# Patient Record
Sex: Female | Born: 1945 | Race: White | Hispanic: No | Marital: Married | Smoking: Never smoker
Health system: Southern US, Community
[De-identification: ages and names within clinical notes are randomized; demographics above are authoritative.]

## PROBLEM LIST (undated history)

## (undated) DIAGNOSIS — Z9889 Other specified postprocedural states: Secondary | ICD-10-CM

## (undated) DIAGNOSIS — I10 Essential (primary) hypertension: Secondary | ICD-10-CM

## (undated) DIAGNOSIS — M25473 Effusion, unspecified ankle: Secondary | ICD-10-CM

## (undated) DIAGNOSIS — F419 Anxiety disorder, unspecified: Secondary | ICD-10-CM

## (undated) DIAGNOSIS — J45909 Unspecified asthma, uncomplicated: Secondary | ICD-10-CM

## (undated) DIAGNOSIS — F32A Depression, unspecified: Secondary | ICD-10-CM

## (undated) DIAGNOSIS — F329 Major depressive disorder, single episode, unspecified: Secondary | ICD-10-CM

## (undated) DIAGNOSIS — R251 Tremor, unspecified: Secondary | ICD-10-CM

## (undated) DIAGNOSIS — R112 Nausea with vomiting, unspecified: Secondary | ICD-10-CM

## (undated) HISTORY — DX: Tremor, unspecified: R25.1

## (undated) HISTORY — PX: FOOT SURGERY: SHX648

## (undated) HISTORY — PX: TUBAL LIGATION: SHX77

---

## 2005-01-10 ENCOUNTER — Encounter: Admission: RE | Admit: 2005-01-10 | Discharge: 2005-01-10 | Payer: Self-pay | Admitting: Obstetrics and Gynecology

## 2005-01-13 ENCOUNTER — Encounter: Admission: RE | Admit: 2005-01-13 | Discharge: 2005-01-13 | Payer: Self-pay | Admitting: Obstetrics and Gynecology

## 2006-01-26 ENCOUNTER — Encounter: Admission: RE | Admit: 2006-01-26 | Discharge: 2006-01-26 | Payer: Self-pay | Admitting: Obstetrics and Gynecology

## 2007-03-02 ENCOUNTER — Encounter: Admission: RE | Admit: 2007-03-02 | Discharge: 2007-03-02 | Payer: Self-pay | Admitting: Obstetrics and Gynecology

## 2008-03-17 ENCOUNTER — Encounter: Admission: RE | Admit: 2008-03-17 | Discharge: 2008-03-17 | Payer: Self-pay | Admitting: Obstetrics and Gynecology

## 2009-03-23 ENCOUNTER — Encounter: Admission: RE | Admit: 2009-03-23 | Discharge: 2009-03-23 | Payer: Self-pay | Admitting: Internal Medicine

## 2010-04-01 ENCOUNTER — Encounter: Admission: RE | Admit: 2010-04-01 | Discharge: 2010-04-01 | Payer: Self-pay | Admitting: Internal Medicine

## 2010-09-12 ENCOUNTER — Encounter: Payer: Self-pay | Admitting: Obstetrics and Gynecology

## 2011-02-24 ENCOUNTER — Other Ambulatory Visit: Payer: Self-pay | Admitting: Internal Medicine

## 2011-02-24 DIAGNOSIS — Z1231 Encounter for screening mammogram for malignant neoplasm of breast: Secondary | ICD-10-CM

## 2011-04-04 ENCOUNTER — Ambulatory Visit
Admission: RE | Admit: 2011-04-04 | Discharge: 2011-04-04 | Disposition: A | Discharge status: Home / Self Care | Source: Ambulatory Visit | Attending: Internal Medicine | Admitting: Internal Medicine

## 2011-04-04 DIAGNOSIS — Z1231 Encounter for screening mammogram for malignant neoplasm of breast: Secondary | ICD-10-CM

## 2012-03-06 ENCOUNTER — Other Ambulatory Visit: Payer: Self-pay | Admitting: Internal Medicine

## 2012-03-06 DIAGNOSIS — Z1231 Encounter for screening mammogram for malignant neoplasm of breast: Secondary | ICD-10-CM

## 2012-04-05 ENCOUNTER — Ambulatory Visit
Admission: RE | Admit: 2012-04-05 | Discharge: 2012-04-05 | Disposition: A | Discharge status: Home / Self Care | Source: Ambulatory Visit | Attending: Internal Medicine | Admitting: Internal Medicine

## 2012-04-05 DIAGNOSIS — Z1231 Encounter for screening mammogram for malignant neoplasm of breast: Secondary | ICD-10-CM

## 2013-03-04 ENCOUNTER — Other Ambulatory Visit: Payer: Self-pay

## 2013-03-04 DIAGNOSIS — Z1231 Encounter for screening mammogram for malignant neoplasm of breast: Secondary | ICD-10-CM

## 2013-04-16 ENCOUNTER — Ambulatory Visit
Admission: RE | Admit: 2013-04-16 | Discharge: 2013-04-16 | Disposition: A | Discharge status: Home / Self Care | Payer: Medicare Other | Source: Ambulatory Visit

## 2013-04-16 DIAGNOSIS — Z1231 Encounter for screening mammogram for malignant neoplasm of breast: Secondary | ICD-10-CM

## 2013-04-29 ENCOUNTER — Other Ambulatory Visit: Payer: Self-pay | Admitting: Internal Medicine

## 2013-04-29 DIAGNOSIS — M0520 Rheumatoid vasculitis with rheumatoid arthritis of unspecified site: Secondary | ICD-10-CM

## 2013-04-29 DIAGNOSIS — M8580 Other specified disorders of bone density and structure, unspecified site: Secondary | ICD-10-CM

## 2013-05-21 ENCOUNTER — Ambulatory Visit
Admission: RE | Admit: 2013-05-21 | Discharge: 2013-05-21 | Disposition: A | Discharge status: Home / Self Care | Payer: Medicare Other | Source: Ambulatory Visit | Attending: Internal Medicine | Admitting: Internal Medicine

## 2013-05-21 DIAGNOSIS — M0520 Rheumatoid vasculitis with rheumatoid arthritis of unspecified site: Secondary | ICD-10-CM

## 2013-05-21 DIAGNOSIS — M8580 Other specified disorders of bone density and structure, unspecified site: Secondary | ICD-10-CM

## 2014-03-21 ENCOUNTER — Other Ambulatory Visit: Payer: Self-pay

## 2014-03-21 DIAGNOSIS — Z1231 Encounter for screening mammogram for malignant neoplasm of breast: Secondary | ICD-10-CM

## 2014-04-18 ENCOUNTER — Ambulatory Visit
Admission: RE | Admit: 2014-04-18 | Discharge: 2014-04-18 | Disposition: A | Discharge status: Home / Self Care | Payer: Medicare Other | Source: Ambulatory Visit

## 2014-04-18 DIAGNOSIS — Z1231 Encounter for screening mammogram for malignant neoplasm of breast: Secondary | ICD-10-CM

## 2015-04-25 ENCOUNTER — Emergency Department (HOSPITAL_BASED_OUTPATIENT_CLINIC_OR_DEPARTMENT_OTHER): Payer: Medicare Other

## 2015-04-25 ENCOUNTER — Encounter (HOSPITAL_BASED_OUTPATIENT_CLINIC_OR_DEPARTMENT_OTHER): Payer: Self-pay | Admitting: *Deleted

## 2015-04-25 ENCOUNTER — Emergency Department (HOSPITAL_BASED_OUTPATIENT_CLINIC_OR_DEPARTMENT_OTHER)
Admission: EM | Admit: 2015-04-25 | Discharge: 2015-04-25 | Disposition: A | Discharge status: Home / Self Care | Payer: Medicare Other | Attending: Emergency Medicine | Admitting: Emergency Medicine

## 2015-04-25 DIAGNOSIS — S52591A Other fractures of lower end of right radius, initial encounter for closed fracture: Secondary | ICD-10-CM | POA: Insufficient documentation

## 2015-04-25 DIAGNOSIS — Y9389 Activity, other specified: Secondary | ICD-10-CM | POA: Diagnosis not present

## 2015-04-25 DIAGNOSIS — Y9289 Other specified places as the place of occurrence of the external cause: Secondary | ICD-10-CM | POA: Diagnosis not present

## 2015-04-25 DIAGNOSIS — J45909 Unspecified asthma, uncomplicated: Secondary | ICD-10-CM | POA: Insufficient documentation

## 2015-04-25 DIAGNOSIS — W010XXA Fall on same level from slipping, tripping and stumbling without subsequent striking against object, initial encounter: Secondary | ICD-10-CM | POA: Diagnosis not present

## 2015-04-25 DIAGNOSIS — Z7951 Long term (current) use of inhaled steroids: Secondary | ICD-10-CM | POA: Diagnosis not present

## 2015-04-25 DIAGNOSIS — Y998 Other external cause status: Secondary | ICD-10-CM | POA: Diagnosis not present

## 2015-04-25 DIAGNOSIS — Z880 Allergy status to penicillin: Secondary | ICD-10-CM | POA: Insufficient documentation

## 2015-04-25 DIAGNOSIS — S6991XA Unspecified injury of right wrist, hand and finger(s), initial encounter: Secondary | ICD-10-CM | POA: Diagnosis present

## 2015-04-25 DIAGNOSIS — S52501A Unspecified fracture of the lower end of right radius, initial encounter for closed fracture: Secondary | ICD-10-CM

## 2015-04-25 HISTORY — DX: Unspecified asthma, uncomplicated: J45.909

## 2015-04-25 MED ORDER — OXYCODONE-ACETAMINOPHEN 5-325 MG PO TABS: 1.0000 | ORAL_TABLET | ORAL | Status: DC | PRN

## 2015-04-25 MED ORDER — ONDANSETRON 4 MG PO TBDP: ORAL_TABLET | ORAL | Status: DC

## 2015-04-25 MED ORDER — TRAMADOL HCL 50 MG PO TABS: 50.0000 mg | ORAL_TABLET | Freq: Four times a day (QID) | ORAL | Status: AC | PRN

## 2015-04-25 NOTE — Discharge Instructions (Signed)
Radial Fracture °You have a broken bone (fracture) of the forearm. This is the part of your arm between the elbow and your wrist. Your forearm is made up of two bones. These are the radius and ulna. Your fracture is in the radial shaft. This is the bone in your forearm located on the thumb side. A cast or splint is used to protect and keep your injured bone from moving. The cast or splint will be on generally for about 5 to 6 weeks, with individual variations. °HOME CARE INSTRUCTIONS  °· Keep the injured part elevated while sitting or lying down. Keep the injury above the level of your heart (the center of the chest). This will decrease swelling and pain. °· Apply ice to the injury for 15-20 minutes, 03-04 times per day while awake, for 2 days. Put the ice in a plastic bag and place a towel between the bag of ice and your cast or splint. °· Move your fingers to avoid stiffness and minimize swelling. °· If you have a plaster or fiberglass cast: °¨ Do not try to scratch the skin under the cast using sharp or pointed objects. °¨ Check the skin around the cast every day. You may put lotion on any red or sore areas. °¨ Keep your cast dry and clean. °· If you have a plaster splint: °¨ Wear the splint as directed. °¨ You may loosen the elastic around the splint if your fingers become numb, tingle, or turn cold or blue. °¨ Do not put pressure on any part of your cast or splint. It may break. Rest your cast only on a pillow for the first 24 hours until it is fully hardened. °· Your cast or splint can be protected during bathing with a plastic bag. Do not lower the cast or splint into water. °· Only take over-the-counter or prescription medicines for pain, discomfort, or fever as directed by your caregiver. °SEEK IMMEDIATE MEDICAL CARE IF:  °· Your cast gets damaged or breaks. °· You have more severe pain or swelling than you did before getting the cast. °· You have severe pain when stretching your fingers. °· There is a bad  smell, new stains and/or pus-like (purulent) drainage coming from under the cast. °· Your fingers or hand turn pale or blue and become cold or your loose feeling. °Document Released: 01/19/2006 Document Revised: 10/31/2011 Document Reviewed: 04/17/2006 °ExitCare® Patient Information ©2015 [employer]. This information is not intended to replace advice given to you by your health care provider. Make sure you discuss any questions you have with your health care provider. ° °

## 2015-04-25 NOTE — ED Notes (Signed)
Patient transported to X-ray 

## 2015-04-25 NOTE — ED Provider Notes (Signed)
CSN: [id number]     Arrival date & time 04/25/15  0941 History   First MD Initiated Contact with Patient 04/25/15 1007     Chief Complaint  Patient presents with  . Wrist Injury     (Consider location/radiation/quality/duration/timing/severity/associated sxs/prior Treatment) HPI Comments: Patient presents with right wrist pain. She states that about an hour ago she was doing yoga and had a fall, landing on her right hand. This was a boost type injury. She complains of constant pain and throbbing to her right wrist since that time. She denies any numbness in the hand. She denies any other injuries other than she feels like her knee is a little bit bruised. She did not hit her head. There is no neck or back pain. She is left-hand dominant.  Patient is a 68 y.o. female presenting with wrist injury.  Wrist Injury Associated symptoms: no back pain, no fever and no neck pain     Past Medical History  Diagnosis Date  . Asthma    Past Surgical History  Procedure Laterality Date  . Tubal ligation     History reviewed. No pertinent family history. Social History  Substance Use Topics  . Smoking status: Never Smoker   . Smokeless tobacco: None  . Alcohol Use: No   OB History    No data available     Review of Systems  Constitutional: Negative for fever.  Gastrointestinal: Negative for nausea and vomiting.  Musculoskeletal: Positive for joint swelling and arthralgias. Negative for back pain and neck pain.  Skin: Negative for wound.  Neurological: Negative for weakness, numbness and headaches.      Allergies  Penicillins and Sulfa antibiotics  Home Medications   Prior to Admission medications   Medication Sig Start Date End Date Taking? Authorizing Provider  Fluticasone-Salmeterol (ADVAIR) 100-50 MCG/DOSE AEPB Inhale 1 puff into the lungs 2 (two) times daily.   Yes Historical Provider, MD  ondansetron (ZOFRAN ODT) 4 MG disintegrating tablet 4mg  ODT q4 hours prn nausea/vomit  04/25/15   Rolan Bucco, MD  oxyCODONE-acetaminophen (PERCOCET) 5-325 MG per tablet Take 1-2 tablets by mouth every 4 (four) hours as needed. 04/25/15   Rolan Bucco, MD  traMADol (ULTRAM) 50 MG tablet Take 1 tablet (50 mg total) by mouth every 6 (six) hours as needed. 04/25/15   Rolan Bucco, MD   BP 111/78 mmHg  Pulse 88  Temp(Src) 98.1 F (36.7 C) (Oral)  Resp 18  Ht 5\' 5"  (1.651 m)  Wt 142 lb (64.411 kg)  BMI 23.63 kg/m2  SpO2 99% Physical Exam  Constitutional: She is oriented to person, place, and time. She appears well-developed and well-nourished.  HENT:  Head: Normocephalic and atraumatic.  Neck: Normal range of motion. Neck supple.  Cardiovascular: Normal rate.   Pulmonary/Chest: Effort normal.  Musculoskeletal: She exhibits edema and tenderness.  Patient has swelling and tenderness to the distal radius. There is no pain to the right hand. No pain to the elbow. No pain to the shoulder. She has normal sensation in the hand. No wounds are noted. She has some limited extension of the thumb due to discomfort but otherwise has normal movement in the hand. She has some minor ecchymosis to the right knee but no significant underlying bony tenderness. No other pain on palpation or range of motion extremities. Radial pulses are intact.  Neurological: She is alert and oriented to person, place, and time.  Skin: Skin is warm and dry.  Psychiatric: She has a normal mood and  affect.    ED Course  Procedures (including critical care time) Labs Review Labs Reviewed - No data to display  Imaging Review Dg Wrist Complete Right  04/25/2015   CLINICAL DATA:  Post fall onto wrist doing yoga now with wrist pain and swelling.  EXAM: RIGHT WRIST - COMPLETE 3+ VIEW  COMPARISON:  None.  FINDINGS: There is a comminuted, displaced fracture involving the distal radius, with foreshortening and angulation, apex dorsal with extension to involve the distal radial carpal joint. Additionally, there is a  minimally displaced ulnar styloid process fracture. No dislocation. Joint spaces are preserved. Expected displacement of pronator quadratus fat pad. Regional soft tissue swelling. No radiopaque foreign body.  IMPRESSION: 1. Acute comminuted displaced fracture involving the distal radius with extension to involve the distal radial carpal joint. 2. Acute minimally displaced fracture of the ulnar styloid process.   Electronically Signed   By: Simonne Come M.D.   On: 04/25/2015 10:12   I have personally reviewed and evaluated these images and lab results as part of my medical decision-making.   EKG Interpretation None      MDM   Final diagnoses:  Distal radius fracture, right, closed, initial encounter    Patient has a distal radius fracture as well as an ulnar styloid fracture. I consulted with Dr. Cheree Ditto made to his reviewed the films. He feels that it needs surgical fixation. His plan is to do surgery tomorrow. I did advise the patient of this. She was placed in a sugar tong splint and the swelling. She states that she normally doesn't do well with pain medication so I gave her prescription for tramadol and Percocet to see which one that she can tolerate. I advised her not to take them both together. I also gave her prescription for Zofran for nausea. [facility] OR will call her with the time for surgery tomorrow. I did advise her to be nothing by mouth after midnight tonight.    Rolan Bucco, MD 04/25/15 1130

## 2015-04-25 NOTE — ED Notes (Signed)
Pt given an ice pack  °

## 2015-04-25 NOTE — ED Notes (Signed)
Pt c/o right wrist injury x 1 hr ago  

## 2015-04-25 NOTE — ED Notes (Signed)
MD at bedside. 

## 2015-04-26 ENCOUNTER — Encounter (HOSPITAL_COMMUNITY): Admission: EM | Disposition: A | Discharge status: Home / Self Care | Payer: Self-pay | Attending: Orthopedic Surgery

## 2015-04-26 ENCOUNTER — Ambulatory Visit (HOSPITAL_COMMUNITY)
Admission: EM | Admit: 2015-04-26 | Discharge: 2015-04-26 | Disposition: A | Discharge status: Home / Self Care | Payer: Medicare Other | Source: Intra-hospital | Attending: Orthopedic Surgery | Admitting: Orthopedic Surgery

## 2015-04-26 ENCOUNTER — Ambulatory Visit (HOSPITAL_COMMUNITY): Payer: Medicare Other | Admitting: Anesthesiology

## 2015-04-26 ENCOUNTER — Encounter (HOSPITAL_COMMUNITY): Payer: Self-pay | Admitting: *Deleted

## 2015-04-26 DIAGNOSIS — Y9342 Activity, yoga: Secondary | ICD-10-CM | POA: Insufficient documentation

## 2015-04-26 DIAGNOSIS — F329 Major depressive disorder, single episode, unspecified: Secondary | ICD-10-CM | POA: Insufficient documentation

## 2015-04-26 DIAGNOSIS — S52571A Other intraarticular fracture of lower end of right radius, initial encounter for closed fracture: Secondary | ICD-10-CM | POA: Diagnosis not present

## 2015-04-26 DIAGNOSIS — I10 Essential (primary) hypertension: Secondary | ICD-10-CM | POA: Insufficient documentation

## 2015-04-26 DIAGNOSIS — Z880 Allergy status to penicillin: Secondary | ICD-10-CM | POA: Diagnosis not present

## 2015-04-26 DIAGNOSIS — X58XXXA Exposure to other specified factors, initial encounter: Secondary | ICD-10-CM | POA: Diagnosis not present

## 2015-04-26 HISTORY — DX: Other specified postprocedural states: Z98.890

## 2015-04-26 HISTORY — DX: Depression, unspecified: F32.A

## 2015-04-26 HISTORY — PX: ORIF WRIST FRACTURE: SHX2133

## 2015-04-26 HISTORY — DX: Anxiety disorder, unspecified: F41.9

## 2015-04-26 HISTORY — DX: Nausea with vomiting, unspecified: R11.2

## 2015-04-26 HISTORY — DX: Essential (primary) hypertension: I10

## 2015-04-26 HISTORY — DX: Effusion, unspecified ankle: M25.473

## 2015-04-26 HISTORY — DX: Major depressive disorder, single episode, unspecified: F32.9

## 2015-04-26 LAB — POCT I-STAT 4, (NA,K, GLUC, HGB,HCT)
Glucose, Bld: 88 mg/dL (ref 65–99)
HEMATOCRIT: 46 % (ref 36.0–46.0)
Hemoglobin: 15.6 g/dL — ABNORMAL HIGH (ref 12.0–15.0)
Potassium: 3.3 mmol/L — ABNORMAL LOW (ref 3.5–5.1)
SODIUM: 127 mmol/L — AB (ref 135–145)

## 2015-04-26 SURGERY — OPEN REDUCTION INTERNAL FIXATION (ORIF) WRIST FRACTURE
Anesthesia: General | Site: Wrist | Laterality: Right

## 2015-04-26 MED ORDER — MIDAZOLAM HCL 5 MG/5ML IJ SOLN
INTRAMUSCULAR | Status: DC | PRN
  Administered 2015-04-26: 2 mg via INTRAVENOUS

## 2015-04-26 MED ORDER — 0.9 % SODIUM CHLORIDE (POUR BTL) OPTIME
TOPICAL | Status: DC | PRN
  Administered 2015-04-26: 1000 mL

## 2015-04-26 MED ORDER — OXYCODONE HCL 5 MG PO TABS: 5.0000 mg | ORAL_TABLET | ORAL | Status: DC | PRN

## 2015-04-26 MED ORDER — HYDROMORPHONE HCL 1 MG/ML IJ SOLN: 0.2500 mg | INTRAMUSCULAR | Status: DC | PRN

## 2015-04-26 MED ORDER — VANCOMYCIN HCL IN DEXTROSE 1-5 GM/200ML-% IV SOLN
INTRAVENOUS | Status: AC
  Filled 2015-04-26: qty 200

## 2015-04-26 MED ORDER — ONDANSETRON HCL 4 MG/2ML IJ SOLN
INTRAMUSCULAR | Status: DC | PRN
  Administered 2015-04-26: 4 mg via INTRAVENOUS

## 2015-04-26 MED ORDER — DOXYCYCLINE HYCLATE 50 MG PO CAPS: 100.0000 mg | ORAL_CAPSULE | Freq: Two times a day (BID) | ORAL | Status: DC

## 2015-04-26 MED ORDER — PROMETHAZINE HCL 25 MG/ML IJ SOLN: 6.2500 mg | INTRAMUSCULAR | Status: DC | PRN

## 2015-04-26 MED ORDER — FENTANYL CITRATE (PF) 100 MCG/2ML IJ SOLN
INTRAMUSCULAR | Status: AC
  Administered 2015-04-26: 100 ug via INTRAVENOUS
  Filled 2015-04-26: qty 2

## 2015-04-26 MED ORDER — LACTATED RINGERS IV SOLN
INTRAVENOUS | Status: DC | PRN
  Administered 2015-04-26: 10:00:00 via INTRAVENOUS

## 2015-04-26 MED ORDER — MIDAZOLAM HCL 2 MG/2ML IJ SOLN
INTRAMUSCULAR | Status: AC
  Filled 2015-04-26: qty 2

## 2015-04-26 MED ORDER — VANCOMYCIN HCL 1000 MG IV SOLR
1000.0000 mg | INTRAVENOUS | Status: DC | PRN
  Administered 2015-04-26: 1000 mg via INTRAVENOUS

## 2015-04-26 MED ORDER — BUPIVACAINE-EPINEPHRINE (PF) 0.5% -1:200000 IJ SOLN
INTRAMUSCULAR | Status: DC | PRN
  Administered 2015-04-26: 30 mL via PERINEURAL

## 2015-04-26 MED ORDER — PROPOFOL INFUSION 10 MG/ML OPTIME
INTRAVENOUS | Status: DC | PRN
  Administered 2015-04-26: 50 ug/kg/min via INTRAVENOUS

## 2015-04-26 SURGICAL SUPPLY — 63 items
BANDAGE ELASTIC 3 VELCRO ST LF (GAUZE/BANDAGES/DRESSINGS) ×3 IMPLANT
BANDAGE ELASTIC 4 VELCRO ST LF (GAUZE/BANDAGES/DRESSINGS) ×3 IMPLANT
BIT DRILL 2.2 SS TIBIAL (BIT) ×3 IMPLANT
BLADE SURG ROTATE 9660 (MISCELLANEOUS) IMPLANT
BNDG CONFORM 2 STRL LF (GAUZE/BANDAGES/DRESSINGS) ×3 IMPLANT
BNDG ESMARK 4X9 LF (GAUZE/BANDAGES/DRESSINGS) ×3 IMPLANT
BNDG GAUZE ELAST 4 BULKY (GAUZE/BANDAGES/DRESSINGS) ×3 IMPLANT
CANISTER SUCTION 2500CC (MISCELLANEOUS) ×3 IMPLANT
CORDS BIPOLAR (ELECTRODE) ×3 IMPLANT
COVER SURGICAL LIGHT HANDLE (MISCELLANEOUS) ×3 IMPLANT
CUFF TOURNIQUET SINGLE 18IN (TOURNIQUET CUFF) ×3 IMPLANT
CUFF TOURNIQUET SINGLE 24IN (TOURNIQUET CUFF) IMPLANT
DRAIN TLS ROUND 10FR (DRAIN) IMPLANT
DRAPE OEC MINIVIEW 54X84 (DRAPES) IMPLANT
DRAPE SURG 17X23 STRL (DRAPES) ×3 IMPLANT
DRSG ADAPTIC 3X8 NADH LF (GAUZE/BANDAGES/DRESSINGS) ×3 IMPLANT
GAUZE SPONGE 4X4 12PLY STRL (GAUZE/BANDAGES/DRESSINGS) ×3 IMPLANT
GAUZE XEROFORM 1X8 LF (GAUZE/BANDAGES/DRESSINGS) ×3 IMPLANT
GLOVE BIOGEL M STRL SZ7.5 (GLOVE) ×3 IMPLANT
GLOVE SS BIOGEL STRL SZ 8 (GLOVE) ×1 IMPLANT
GLOVE SUPERSENSE BIOGEL SZ 8 (GLOVE) ×2
GOWN STRL REUS W/ TWL LRG LVL3 (GOWN DISPOSABLE) ×1 IMPLANT
GOWN STRL REUS W/ TWL XL LVL3 (GOWN DISPOSABLE) ×2 IMPLANT
GOWN STRL REUS W/TWL LRG LVL3 (GOWN DISPOSABLE) ×2
GOWN STRL REUS W/TWL XL LVL3 (GOWN DISPOSABLE) ×4
KIT BASIN OR (CUSTOM PROCEDURE TRAY) ×3 IMPLANT
KIT ROOM TURNOVER OR (KITS) ×3 IMPLANT
LOOP VESSEL MAXI BLUE (MISCELLANEOUS) IMPLANT
MANIFOLD NEPTUNE II (INSTRUMENTS) ×3 IMPLANT
NEEDLE 22X1 1/2 (OR ONLY) (NEEDLE) IMPLANT
NS IRRIG 1000ML POUR BTL (IV SOLUTION) ×3 IMPLANT
PACK ORTHO EXTREMITY (CUSTOM PROCEDURE TRAY) ×3 IMPLANT
PAD ARMBOARD 7.5X6 YLW CONV (MISCELLANEOUS) ×6 IMPLANT
PAD CAST 3X4 CTTN HI CHSV (CAST SUPPLIES) ×1 IMPLANT
PAD CAST 4YDX4 CTTN HI CHSV (CAST SUPPLIES) ×1 IMPLANT
PADDING CAST COTTON 3X4 STRL (CAST SUPPLIES) ×2
PADDING CAST COTTON 4X4 STRL (CAST SUPPLIES) ×2
PEG LOCKING SMOOTH 2.2X18 (Peg) ×6 IMPLANT
PEG LOCKING SMOOTH 2.2X20 (Screw) ×6 IMPLANT
PEG LOCKING SMOOTH 2.2X22 (Screw) ×3 IMPLANT
PLATE DVR VOLAR RIM NARROW RT (Plate) ×3 IMPLANT
PUTTY DBM STAGRAFT PLUS 2CC (Putty) ×3 IMPLANT
SCREW LOCK 14X2.7X 3 LD TPR (Screw) ×3 IMPLANT
SCREW LOCK 18X2.7X 3 LD TPR (Screw) ×1 IMPLANT
SCREW LOCKING 2.7X13MM (Screw) ×3 IMPLANT
SCREW LOCKING 2.7X14 (Screw) ×6 IMPLANT
SCREW LOCKING 2.7X15MM (Screw) ×3 IMPLANT
SCREW LOCKING 2.7X18 (Screw) ×2 IMPLANT
SCREW MULTI DIRECTIONAL 2.7X24 (Screw) ×3 IMPLANT
SPLINT FIBERGLASS 3X12 (CAST SUPPLIES) ×3 IMPLANT
SPONGE LAP 4X18 X RAY DECT (DISPOSABLE) IMPLANT
SUT MNCRL AB 4-0 PS2 18 (SUTURE) ×3 IMPLANT
SUT PROLENE 3 0 PS 2 (SUTURE) IMPLANT
SUT PROLENE 4 0 P 3 18 (SUTURE) ×6 IMPLANT
SUT VIC AB 3-0 FS2 27 (SUTURE) ×3 IMPLANT
SYR CONTROL 10ML LL (SYRINGE) IMPLANT
SYSTEM CHEST DRAIN TLS 7FR (DRAIN) ×3 IMPLANT
TOWEL OR 17X24 6PK STRL BLUE (TOWEL DISPOSABLE) ×3 IMPLANT
TOWEL OR 17X26 10 PK STRL BLUE (TOWEL DISPOSABLE) ×3 IMPLANT
TUBE CONNECTING 12'X1/4 (SUCTIONS) ×1
TUBE CONNECTING 12X1/4 (SUCTIONS) ×2 IMPLANT
TUBE EVACUATION TLS (MISCELLANEOUS) ×3 IMPLANT
WATER STERILE IRR 1000ML POUR (IV SOLUTION) ×3 IMPLANT

## 2015-04-26 NOTE — Op Note (Signed)
See dictation [id number]  Status post comminuted complex ORIF right distal radius fracture with volar Barton's component  Terry Thomas M.D.

## 2015-04-26 NOTE — Progress Notes (Signed)
Per Dr. Krista Blue no EKG needed.

## 2015-04-26 NOTE — Anesthesia Procedure Notes (Signed)
Anesthesia Regional Block:  Supraclavicular block  Pre-Anesthetic Checklist: ,, timeout performed, Correct Patient, Correct Site, Correct Laterality, Correct Procedure,, site marked, risks and benefits discussed, Surgical consent,  Pre-op evaluation,  At surgeon's request and post-op pain management  Laterality: Right  Prep: chloraprep       Needles:  Injection technique: Single-shot  Needle Type: Echogenic Stimulator Needle     Needle Length: 5cm 5 cm Needle Gauge: 22 and 22 G    Additional Needles:  Procedures: ultrasound guided (picture in chart) and nerve stimulator Supraclavicular block  Nerve Stimulator or Paresthesia:  Response: bicep contraction, 0.48 mA,   Additional Responses:   Narrative:  Start time: 04/26/2015 10:17 AM End time: 04/26/2015 10:27 AM Injection made incrementally with aspirations every 5 mL.  Performed by: Personally   Additional Notes: Functioning IV was confirmed and monitors applied.  A 50mm 22ga echogenic arrow stimulator was used. Sterile prep and drape,hand hygiene and sterile gloves were used.Ultrasound guidance: relevent anatomy identified, needle position confirmed, local anesthetic spread visualized around nerve(s)., vascular puncture avoided.  Image printed for medical record.  Negative aspiration and negative test dose prior to incremental administration of local anesthetic. The patient tolerated the procedure well.

## 2015-04-26 NOTE — Anesthesia Postprocedure Evaluation (Signed)
Anesthesia Post Note  Patient: Terry Thomas  Procedure(s) Performed: Procedure(s) (LRB): OPEN REDUCTION INTERNAL FIXATION (ORIF) WRIST FRACTURE (Right)  Anesthesia type: MAC  Patient location: PACU  Post pain: Pain level controlled  Post assessment: Patient's Cardiovascular Status Stable  Last Vitals:  Filed Vitals:   04/26/15 0953  BP: 123/74  Pulse: 88  Temp: 36.8 C  Resp: 18    Post vital signs: Reviewed and stable  Level of consciousness: sedated  Complications: No apparent anesthesia complications

## 2015-04-26 NOTE — Transfer of Care (Signed)
Immediate Anesthesia Transfer of Care Note  Patient: Terry Thomas  Procedure(s) Performed: Procedure(s): OPEN REDUCTION INTERNAL FIXATION (ORIF) WRIST FRACTURE (Right)  Patient Location: PACU  Anesthesia Type:Regional and MAC combined with regional for post-op pain  Level of Consciousness: awake, oriented and patient cooperative  Airway & Oxygen Therapy: Patient Spontanous Breathing  Post-op Assessment: Report given to RN, Post -op Vital signs reviewed and stable, Patient moving all extremities and X 3  Post vital signs: Reviewed and stable  Last Vitals:  Filed Vitals:   04/26/15 0953  BP: 123/74  Pulse: 88  Temp: 36.8 C  Resp: 18    Complications: No apparent anesthesia complications

## 2015-04-26 NOTE — H&P (Signed)
Terry Thomas is an 69 y.o. female.   Chief Complaint: Right distal radius fracture comminuted complex intra-articular nature greater than 5 parts HPI: Patient presents for open reduction internal fixation right distal radius fracture. This a greater than 5 part interarticular fracture. Patient denies other issues.  Patient injured the wrist while doing yoga.  She denies neck back chest or abdominal pain  Past Medical History  Diagnosis Date  . Asthma   . PONV (postoperative nausea and vomiting)   . Anxiety   . Depression   . Ankle edema   . Hypertension     history of     Past Surgical History  Procedure Laterality Date  . Tubal ligation      History reviewed. No pertinent family history. Social History:  reports that she has never smoked. She does not have any smokeless tobacco history on file. She reports that she does not drink alcohol or use illicit drugs.  Allergies:  Allergies  Allergen Reactions  . Penicillins   . Percocet [Oxycodone-Acetaminophen] Nausea And Vomiting    *pt reports she can take but prefers not to due to severe nausea/vomiting*  . Sulfa Antibiotics     Medications Prior to Admission  Medication Sig Dispense Refill  . ADVAIR DISKUS 500-50 MCG/DOSE AEPB Inhale 1 puff into the lungs 2 (two) times daily.    Marland Kitchen albuterol (PROVENTIL HFA;VENTOLIN HFA) 108 (90 BASE) MCG/ACT inhaler Inhale 1-2 puffs into the lungs every 6 (six) hours as needed for wheezing or shortness of breath.    . ALPRAZolam (XANAX) 0.25 MG tablet Take 0.25 mg by mouth daily as needed for anxiety.     . budesonide (RHINOCORT ALLERGY) 32 MCG/ACT nasal spray Place 2 sprays into both nostrils every morning.    . Cholecalciferol (VITAMIN D-3) 1000 UNITS CAPS Take 1,000 Units by mouth daily.    . clonazePAM (KLONOPIN) 1 MG tablet Take 1 mg by mouth at bedtime as needed (sleep).     Marland Kitchen estradiol-norethindrone (ACTIVELLA) 1-0.5 MG per tablet Take 1 tablet by mouth daily.    . Homeopathic  Products (ALLERGY MEDICINE PO) Take 1 tablet by mouth daily.    . hydrochlorothiazide (MICROZIDE) 12.5 MG capsule Take 12.5 mg by mouth daily as needed (edema).    Marland Kitchen ibuprofen (ADVIL,MOTRIN) 200 MG tablet Take 400 mg by mouth every 6 (six) hours as needed for mild pain or moderate pain.    Marland Kitchen ondansetron (ZOFRAN ODT) 4 MG disintegrating tablet  ODT q4 hours prn nausea/vomit 10 tablet 0  . oxyCODONE-acetaminophen (PERCOCET) 5-325 MG per tablet Take 1-2 tablets by mouth every 4 (four) hours as needed. 20 tablet 0  . traMADol (ULTRAM) 50 MG tablet Take 1 tablet (50 mg total) by mouth every 6 (six) hours as needed. 15 tablet 0    No results found for this or any previous visit (from the past 48 hour(s)). Dg Wrist Complete Right  04/25/2015   CLINICAL DATA:  Post fall onto wrist doing yoga now with wrist pain and swelling.  EXAM: RIGHT WRIST - COMPLETE 3+ VIEW  COMPARISON:  None.  FINDINGS: There is a comminuted, displaced fracture involving the distal radius, with foreshortening and angulation, apex dorsal with extension to involve the distal radial carpal joint. Additionally, there is a minimally displaced ulnar styloid process fracture. No dislocation. Joint spaces are preserved. Expected displacement of pronator quadratus fat pad. Regional soft tissue swelling. No radiopaque foreign body.  IMPRESSION: 1. Acute comminuted displaced fracture involving the distal radius with  extension to involve the distal radial carpal joint. 2. Acute minimally displaced fracture of the ulnar styloid process.   Electronically Signed   By: Simonne Come M.D.   On: 04/25/2015 10:12    Review of Systems  Eyes: Negative.   Respiratory: Negative.   Cardiovascular: Negative.   Gastrointestinal: Negative.   Genitourinary: Negative.   Neurological: Negative.   Endo/Heme/Allergies: Negative.   Psychiatric/Behavioral: Negative.     There were no vitals taken for this visit. Physical Exam  Patient is comminuted complex  distal radius fracture with significant disarray of the soft tissue. She has intact sensation. It is difficult to move the fingers due to pain. The patient is alert and oriented in no acute distress. The patient complains of pain in the affected upper extremity.  The patient is noted to have a normal HEENT exam. Lung fields show equal chest expansion and no shortness of breath. Abdomen exam is nontender without distention. Lower extremity examination does not show any fracture dislocation or blood clot symptoms. Pelvis is stable and the neck and back are stable and nontender. Assessment/Plan Will plan for open reduction internal fixation right distal radius with our graft bone graft is necessary and repair reconstruction is necessary. She understands all risk and benefits and desires to proceed. We are planning surgery for your upper extremity. The risk and benefits of surgery to include risk of bleeding, infection, anesthesia,  damage to normal structures and failure of the surgery to accomplish its intended goals of relieving symptoms and restoring function have been discussed in detail. With this in mind we plan to proceed. I have specifically discussed with the patient the pre-and postoperative regime and the dos and don'ts and risk and benefits in great detail. Risk and benefits of surgery also include risk of dystrophy(CRPS), chronic nerve pain, failure of the healing process to go onto completion and other inherent risks of surgery The relavent the pathophysiology of the disease/injury process, as well as the alternatives for treatment and postoperative course of action has been discussed in great detail with the patient who desires to proceed.  We will do everything in our power to help you (the patient) restore function to the upper extremity. It is a pleasure to see this patient today.  Karen Chafe 04/26/2015, 9:52 AM

## 2015-04-26 NOTE — Anesthesia Preprocedure Evaluation (Addendum)
Anesthesia Evaluation  Patient identified by MRN, date of birth, ID band Patient awake    Reviewed: Allergy & Precautions, NPO status , Patient's Chart, lab work & pertinent test results  History of Anesthesia Complications Negative for: history of anesthetic complications  Airway Mallampati: II  TM Distance: >3 FB Neck ROM: Full    Dental  (+) Teeth Intact, Dental Advisory Given   Pulmonary asthma ,    Pulmonary exam normal       Cardiovascular hypertension, negative cardio ROS Normal cardiovascular exam    Neuro/Psych negative neurological ROS  negative psych ROS   GI/Hepatic negative GI ROS, Neg liver ROS,   Endo/Other  negative endocrine ROS  Renal/GU negative Renal ROS     Musculoskeletal   Abdominal   Peds  Hematology   Anesthesia Other Findings   Reproductive/Obstetrics                            Anesthesia Physical Anesthesia Plan  ASA: II  Anesthesia Plan: General   Post-op Pain Management: GA combined w/ Regional for post-op pain   Induction: Intravenous  Airway Management Planned: LMA  Additional Equipment:   Intra-op Plan:   Post-operative Plan: Extubation in OR  Informed Consent: I have reviewed the patients History and Physical, chart, labs and discussed the procedure including the risks, benefits and alternatives for the proposed anesthesia with the patient or authorized representative who has indicated his/her understanding and acceptance.   Dental advisory given  Plan Discussed with: CRNA, Anesthesiologist and Surgeon  Anesthesia Plan Comments:         Anesthesia Quick Evaluation

## 2015-04-26 NOTE — Discharge Instructions (Signed)

## 2015-04-27 NOTE — Op Note (Signed)
NAME:  Terry Thomas, Terry Thomas                 ACCOUNT NO.:  MEDICAL RECORD NO.:  [id number]  LOCATION:                                 FACILITY:  PHYSICIAN:  Dionne Ano. Lailah Marcelli, M.D.DATE OF BIRTH:  06-12-1946  DATE OF PROCEDURE: DATE OF DISCHARGE:                              OPERATIVE REPORT   PREOPERATIVE DIAGNOSIS:  Comminuted complex greater than 5 part intra- articular distal radius fracture with volar Barton's component.  POSTOPERATIVE DIAGNOSIS:  Comminuted complex greater than 5 part intra- articular distal radius fracture with volar Barton's component.  PROCEDURE: 1. Open reduction and internal fixation, greater than 5-part intra-     articular distal radius fracture with DVR volar rim plate from     Biomet and SteriGraft bone graft secondary to metaphyseal defect. 2. AP lateral and oblique x-rays performed, examined, and interpreted     by myself.  SURGEON:  Dionne Ano. Amanda Pea, MD  ASSISTANT:  Karie Chimera, PA-C  COMPLICATIONS:  None.  ANESTHESIA:  Block anesthesia with IV sedation.  TOURNIQUET TIME:  Less than an hour.  DRAINS:  One.  INDICATIONS:  Pleasant female, presents with the above-mentioned diagnosis.  I have counseled her in regard to risks and benefits of surgery and she desires to proceed the above-mentioned operative intervention.  All questions have been encouraged and answered preoperatively.  OPERATIVE PROCEDURE:  The patient was seen by myself and Anesthesia, taken to the operative theater.  She had a block placed by Dr. Adonis Huguenin, this was in excellent working fashion.  Given her pulmonary issues, we gave her some light IV sedation, and did not place her under general anesthetic.  The arm was very carefully prepped and draped in usual sterile fashion with Betadine scrub and paint.  Following this, outline marks were made and tourniquet was insufflated.  Time-out had been called.  Pre and postop check was complete and a volar radial incision  was made.  Dissection was carried down.  FCR tendon sheath was incised dorsally and palmarly.  Following this, carpal canal contents were retracted ulnarly.  Pronator was incised and fracture was accessed. At this time, we very carefully and cautiously, reassembled the comminuted pieces and placed approximately 2 mL SteriGraft bone graft in the metaphyseal defect.  Following this, reduction was obtained and we were able to achieve adequate radial height, inclination and volar tilt, with a narrow volar rim DVR plate applied.  The patient tolerated this well.  There were no complicating features.  Standard AO technique was used for insertion and there were no complicating features.  Following this, the patient then underwent a very careful and cautious irrigation followed by pronator repair.  X-rays in AP, lateral and oblique were performed, examined, and interpreted and looked to be excellent.  I was pleased with the recreation of geometry and findings.  The skin was closed with Prolene over TLS drain which was hooked up to suction.  We will see her back in the office in 12-14 days.  She will notify us should any problems occur.  Do's and don'ts have been discussed and all questions have been encouraged and answered.  We will proceed according to our standard DVR  protocol; however I am going to go slow and easy with any aggressive measures as this was highly comminuted.  For 69 year old female, she does have a high degree comminution. Elevation, move, massage fingers, and other measures were discussed. Should any problems arise, she will notify us.     Dionne Ano. Amanda Pea, M.D.     [facility]  D:  04/26/2015  T:  04/26/2015  Job:  [id number]

## 2015-04-28 ENCOUNTER — Encounter (HOSPITAL_COMMUNITY): Payer: Self-pay | Admitting: Orthopedic Surgery

## 2015-04-30 ENCOUNTER — Encounter (HOSPITAL_COMMUNITY): Payer: Self-pay | Admitting: Orthopedic Surgery

## 2015-05-12 ENCOUNTER — Encounter: Payer: Self-pay | Admitting: Family Medicine

## 2015-05-12 ENCOUNTER — Ambulatory Visit (INDEPENDENT_AMBULATORY_CARE_PROVIDER_SITE_OTHER): Payer: Medicare Other | Admitting: Family Medicine

## 2015-05-12 VITALS — Ht 65.0 in

## 2015-05-12 DIAGNOSIS — Z713 Dietary counseling and surveillance: Secondary | ICD-10-CM

## 2015-05-12 NOTE — Patient Instructions (Addendum)
-   Incorporate at least some whole grains to your diet (see handout on Weil's Anti-infl Diet). - Eat at least 3 meals and 1-2 snacks per day.  Aim for no more than 5 hours between eating.  Eat breakfast within one hour of getting up.   - A REAL meal includes a source of protein, starch, and veg's and/or fruit.    - Would you serve this to a guest in your home, and call it a meal? - Importance of ENOUGH food / calories / carb:  Healing is NOT going to happen well without adequate foods/energy.    - Besides grains, you need more dietary variety:  - Other kinds of nuts/seeds.    - Vegetables:  Work on building up your repertoire, including leafy greens.    - Protein sources: Eggs, beans, soy foods, meat from grass-fed animals, free-range chickens.    - Consider eventually:  Fermented foods (sauerkraut, kimchee, other "pickled" veg's, cheese, tofu, tempeh, homemade, sourdough bread, kombucha, etc.)  - LET ME KNOW IF YOUR INSURANCE (TRI-CARE, MEDICARE) COVERS YOUR MNT APPT.

## 2015-05-12 NOTE — Progress Notes (Signed)
Medical Nutrition Therapy:  Appt start time: 1500 end time:  1600.  Assessment:  Primary concerns today: Anti-inflammatory diet (related to RA).  Terry Thomas was accompanied by her husband Elijah Birk today.  We did not check her weight b/c she has a cast on her arm from a recent surgery following a fall.    [name] would like to improve her relationship with food, which has become especially complicated in the past few years.  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  Started having breathing problems last Nov, which was severe by May 2016 (suspected to be related to mold at her church).  Workup by allergist Dr. Natasha Mead (PhD in immunology) in [place] for that condition suggested the dx of RA was inaccurate, so she stopped her methotrexate.  Will repeat tests after she's been off methotrexate for several months.  Dr. 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  She has received lots of advice from everyone as to what she should and should not eat, to the point where she is somewhat fearful of many foods, and at times, is not very interested in eating at all.   [name] tries to avoid milk and yogurt b/c of lactose intolerance, and other dairy foods, as well as grains, gluten, beef, pork, and sugar in an effort to eat a more anti-inflammatory diet.    Learning Readiness: Ready  Barriers to learning/adherence to lifestyle change: [name] has always had what she calls and obsession with her weight.  Still weighs herself twice a day.   Usual eating pattern includes 1 meals and 2-3 snacks per day. Frequent foods and beverages include almond butter, apples, pineapple, grapes, tomatoes, most veg's, seafood/fish, smoothies (kale & fruit) and beans.  Avoided foods include also include peanut butter (but does eat almond butter).   Usual physical activity includes wakling 1/2-1 miles (up to  12 min) 3-6 days a week.  Currently dealing with a broken wrist, so is limited.  Has two flights of stairs at home.    24-hr recall: (Up at 6:30 AM) B (8:30 AM)-   1/2 apple, 2 T almond butter, 2 c black coffee  Snk (11:30)-   1 slc dry toast  L (1:30 PM)-  16 oz fruit smoothie (banana, str'ber, water) Snk ( PM)-   D (6:30 PM)-  2 scrmbld eggs, 1/2 slc bacon, tomato Snk (10 PM)-  3 apple slices, 1 T alm butter Typical day? Yes.    Progress Towards Goal(s):  In progress.   Nutritional Diagnosis:  NB-1.1 Food and nutrition-related knowledge deficit As related to nutritional needs for health condition.  As evidenced by patient's expressed anxiety about best diet for battling inflammation.    Intervention:  Nutrition education.  Handouts given during visit include:  AVS  Handout on Anti-inflammatory Diet  Recipes  Demonstrated degree of understanding via:  Teach Back   Monitoring/Evaluation:  Dietary intake, exercise, and body weight:  [name] will call for follow-up as needed.

## 2015-05-19 ENCOUNTER — Ambulatory Visit: Payer: Medicare Other | Admitting: Family Medicine

## 2015-08-11 ENCOUNTER — Other Ambulatory Visit: Payer: Self-pay | Admitting: Internal Medicine

## 2015-08-11 DIAGNOSIS — M8580 Other specified disorders of bone density and structure, unspecified site: Secondary | ICD-10-CM

## 2015-08-11 DIAGNOSIS — Z1231 Encounter for screening mammogram for malignant neoplasm of breast: Secondary | ICD-10-CM

## 2015-09-21 ENCOUNTER — Ambulatory Visit
Admission: RE | Admit: 2015-09-21 | Discharge: 2015-09-21 | Disposition: A | Discharge status: Home / Self Care | Payer: Medicare Other | Source: Ambulatory Visit | Attending: Internal Medicine | Admitting: Internal Medicine

## 2015-09-21 DIAGNOSIS — Z1231 Encounter for screening mammogram for malignant neoplasm of breast: Secondary | ICD-10-CM

## 2015-09-21 DIAGNOSIS — M8580 Other specified disorders of bone density and structure, unspecified site: Secondary | ICD-10-CM

## 2015-09-22 ENCOUNTER — Other Ambulatory Visit: Payer: Self-pay | Admitting: Internal Medicine

## 2015-09-22 DIAGNOSIS — R928 Other abnormal and inconclusive findings on diagnostic imaging of breast: Secondary | ICD-10-CM

## 2015-09-28 ENCOUNTER — Ambulatory Visit
Admission: RE | Admit: 2015-09-28 | Discharge: 2015-09-28 | Disposition: A | Discharge status: Home / Self Care | Payer: Medicare Other | Source: Ambulatory Visit | Attending: Internal Medicine | Admitting: Internal Medicine

## 2015-09-28 DIAGNOSIS — R928 Other abnormal and inconclusive findings on diagnostic imaging of breast: Secondary | ICD-10-CM

## 2019-09-22 ENCOUNTER — Ambulatory Visit: Payer: Medicare Other

## 2019-09-28 ENCOUNTER — Ambulatory Visit: Payer: Medicare Other

## 2019-10-03 ENCOUNTER — Ambulatory Visit: Payer: Medicare Other

## 2022-05-22 ENCOUNTER — Other Ambulatory Visit: Payer: Self-pay

## 2022-05-22 ENCOUNTER — Encounter (HOSPITAL_BASED_OUTPATIENT_CLINIC_OR_DEPARTMENT_OTHER): Payer: Self-pay | Admitting: Emergency Medicine

## 2022-05-22 ENCOUNTER — Emergency Department (HOSPITAL_BASED_OUTPATIENT_CLINIC_OR_DEPARTMENT_OTHER): Payer: Medicare Other

## 2022-05-22 ENCOUNTER — Emergency Department (HOSPITAL_BASED_OUTPATIENT_CLINIC_OR_DEPARTMENT_OTHER)
Admission: EM | Admit: 2022-05-22 | Discharge: 2022-05-22 | Disposition: A | Discharge status: Home / Self Care | Payer: Medicare Other | Attending: Student | Admitting: Student

## 2022-05-22 DIAGNOSIS — X500XXA Overexertion from strenuous movement or load, initial encounter: Secondary | ICD-10-CM | POA: Insufficient documentation

## 2022-05-22 DIAGNOSIS — Y93K1 Activity, walking an animal: Secondary | ICD-10-CM | POA: Insufficient documentation

## 2022-05-22 DIAGNOSIS — S82831A Other fracture of upper and lower end of right fibula, initial encounter for closed fracture: Secondary | ICD-10-CM | POA: Diagnosis not present

## 2022-05-22 DIAGNOSIS — S99911A Unspecified injury of right ankle, initial encounter: Secondary | ICD-10-CM | POA: Diagnosis present

## 2022-05-22 MED ORDER — OXYCODONE HCL 5 MG PO TABS: 5.0000 mg | ORAL_TABLET | Freq: Four times a day (QID) | ORAL | 0 refills | Status: AC | PRN

## 2022-05-22 MED ORDER — ONDANSETRON 4 MG PO TBDP: 4.0000 mg | ORAL_TABLET | Freq: Three times a day (TID) | ORAL | 0 refills | Status: AC | PRN

## 2022-05-22 NOTE — ED Triage Notes (Signed)
Pt fell walking dog today; c/o RT ankle pain; +swelling

## 2022-05-22 NOTE — ED Notes (Signed)
Patients right ankle is swollen but is not discolored. Patient has some range of movement but is painful.

## 2022-05-22 NOTE — Discharge Instructions (Addendum)
As we discussed, you do have a fracture of your right fibula.  I have given you a boot and crutches for same.  You will need to avoid bearing weight on this until you see orthopedics to further discuss management of this fracture.  I have given you a referral to orthopedics with a number to call to schedule an appointment for continued evaluation and management of same.  In the interim, please rest, ice, compress, and elevate your ankle and take Tylenol as needed for pain.  Return if development of any new or worsening symptoms.

## 2022-05-22 NOTE — ED Provider Notes (Cosign Needed Addendum)
[facility] Provider Note   CSN: [id number] Arrival date & time: 05/22/22  1842     History  Chief Complaint  Patient presents with   Ankle Pain    Terry Thomas is a 76 y.o. female.  Patient with history of hypertension, anxiety, and depression presents today with complaints of right ankle injury.  She states that same occurred immediately prior to arrival this evening when she was walking her dog.  She states that she took a wrong step and rolled her right ankle.  She endorses immediate pain to same.  She did not hit her head or lose consciousness.  She is not anticoagulated.  She was able to walk back to her home with some pain.  Denies any other injuries or complaints.  The history is provided by the patient. No language interpreter was used.  Ankle Pain      Home Medications Prior to Admission medications   Medication Sig Start Date End Date Taking? Authorizing Provider  ALPRAZolam Duanne Moron) 0.25 MG tablet Take 0.25 mg by mouth daily as needed for anxiety.  04/15/15  Yes [provider]  buPROPion (WELLBUTRIN XL) 300 MG 24 hr tablet Take 300 mg by mouth daily.   Yes [provider]  Calcium-Phosphorus-Vitamin D (CITRACAL +D3 PO) Take by mouth.   Yes [provider]  Cholecalciferol (VITAMIN D-3) 1000 UNITS CAPS Take 1,000 Units by mouth daily.   Yes [provider]  clonazePAM (KLONOPIN) 1 MG tablet Take 1 mg by mouth at bedtime as needed (sleep).  04/15/15  Yes [provider]  estradiol-norethindrone (ACTIVELLA) 1-0.5 MG per tablet Take 1 tablet by mouth daily. 04/09/15  Yes [provider]  ketorolac (TORADOL) 15 MG/ML injection Inject 15 mg into the vein once as needed for mild pain.   Yes [provider]  MAGNESIUM CARBONATE PO Take by mouth.   Yes [provider]  methotrexate (RHEUMATREX) 2.5 MG tablet Take 2.5 mg by mouth once a week. Caution:Chemotherapy. Protect from  light.   Yes [provider]  Omega-3 Fatty Acids (FISH OIL) 1000 MG CAPS Take by mouth.   Yes [provider]  ondansetron (ZOFRAN ODT) 4 MG disintegrating tablet 4mg  ODT q4 hours prn nausea/vomit 04/25/15  Yes Malvin Johns, MD  albuterol (PROVENTIL HFA;VENTOLIN HFA) 108 (90 BASE) MCG/ACT inhaler Inhale 1-2 puffs into the lungs every 6 (six) hours as needed for wheezing or shortness of breath.    [provider]  Ascorbic Acid (VITAMIN C) 1000 MG tablet Take 1,000 mg by mouth daily.    [provider]  budesonide (RHINOCORT ALLERGY) 32 MCG/ACT nasal spray Place 2 sprays into both nostrils every morning.    [provider]  budesonide-formoterol (SYMBICORT) 160-4.5 MCG/ACT inhaler Inhale 2 puffs into the lungs 2 (two) times daily.    [provider]  Homeopathic Products (ALLERGY MEDICINE PO) Take 1 tablet by mouth daily.    [provider]  hydrochlorothiazide (MICROZIDE) 12.5 MG capsule Take 12.5 mg by mouth daily as needed (edema).    [provider]  traMADol (ULTRAM) 50 MG tablet Take 1 tablet (50 mg total) by mouth every 6 (six) hours as needed. 04/25/15   Malvin Johns, MD      Allergies    Penicillins, Percocet [oxycodone-acetaminophen], and Sulfa antibiotics    Review of Systems   Review of Systems  Musculoskeletal:  Positive for arthralgias.  All other systems reviewed and are negative.   Physical Exam  Updated Vital Signs BP (!) 149/81 (BP Location: Right Arm)   Pulse 67   Temp 98.3 F (36.8 C) (Oral)   Resp 18   Ht 5\' 5"  (1.651 m)   Wt 65.8 kg   SpO2 97%   BMI 24.13 kg/m  Physical Exam Vitals and nursing note reviewed.  Constitutional:      General: She is not in acute distress.    Appearance: Normal appearance. She is normal weight. She is not ill-appearing, toxic-appearing or diaphoretic.  HENT:     Head: Normocephalic and atraumatic.  Cardiovascular:     Rate and Rhythm: Normal rate.   Pulmonary:     Effort: Pulmonary effort is normal. No respiratory distress.  Musculoskeletal:        General: Normal range of motion.     Cervical back: Normal range of motion.     Comments: Swelling and tenderness to palpation of the right lateral malleolus.  ROM intact with some pain.  DP and PT pulses intact and 2+.  Capillary refill less than 2 seconds.  No obvious deformity.  Skin:    General: Skin is warm and dry.  Neurological:     General: No focal deficit present.     Mental Status: She is alert.  Psychiatric:        Mood and Affect: Mood normal.        Behavior: Behavior normal.     ED Results / Procedures / Treatments   Labs (all labs ordered are listed, but only abnormal results are displayed) Labs Reviewed - No data to display  EKG None  Radiology DG Ankle Complete Right  Result Date: 05/22/2022 CLINICAL DATA:  Injury. 07/22/2022, felt a pop in right ankle. Pain, cannot bear weight. EXAM: RIGHT ANKLE - COMPLETE 3+ VIEW COMPARISON:  X-ray left foot 11/16/2017 FINDINGS: Acute minimally displaced distal fibular fracture. No dislocation. There is no evidence of arthropathy or other focal bone abnormality. Soft tissues are unremarkable. IMPRESSION: Acute minimally displaced distal fibular fracture. Electronically Signed   By: 11/18/2017 M.D.   On: 05/22/2022 19:51    Procedures Procedures    Medications Ordered in ED Medications - No data to display  ED Course/ Medical Decision Making/ A&P                           Medical Decision Making Amount and/or Complexity of Data Reviewed Radiology: ordered.  Risk Prescription drug management.   Patient presents today with complaints of right ankle injury.  She is afebrile, nontoxic-appearing, no acute distress with reassuring vital signs.  X-ray imaging obtained of same which revealed a minimally displaced distal fibular fracture.  I have personally reviewed and interpreted this imaging and agree with radiology  interpretation.  Discussed management of same with Dr. 07/22/2022 from orthopedic surgery who recommends management with ace wrap and CAM boot with no weight bearing. Patient placed in same and given crutches to help with ambulation. Patient also given oxycodone for pain. Also given Zofran as she states oxycodone makes her nauseous and zofran remedies this. Given ortho referral for further evaluation and recommend RICE with tylenol for pain. Patient is understanding and amenable with plan, educated on red flag symptoms that would prompt immediate return.  Patient discharged in stable condition.  This is a shared visit with supervising physician Dr. August Saucer who has independently evaluated patient & provided guidance in evaluation/management/disposition, in agreement with care   Final Clinical Impression(s) / ED  Diagnoses Final diagnoses:  Closed fracture of distal end of right fibula, unspecified fracture morphology, initial encounter    Rx / DC Orders ED Discharge Orders          Ordered    oxyCODONE (ROXICODONE) 5 MG immediate release tablet  Every 6 hours PRN        05/22/22 2157    ondansetron (ZOFRAN-ODT) 4 MG disintegrating tablet  Every 8 hours PRN        05/22/22 2157          An After Visit Summary was printed and given to the patient.     Silva Bandy, PA-C 05/22/22 2150    Silva Bandy, PA-C 05/22/22 2158    Glendora Score, MD 05/23/22 1319

## 2022-05-22 NOTE — ED Notes (Signed)
ED Provider at bedside. 

## 2022-05-22 NOTE — Progress Notes (Signed)
Radiographs reviewed.  Minimally displaced fibular fracture at most 1 mm with maintenance of the medial clear space.  Recommend Ace wrap elevation minimal weightbearing with walker and/or crutches and follow-up with [facility] in [place] in 4 to 5 days either Thursday or Friday for repeat radiographs.  Unless she walks on it the more likely she has to avoid surgery.  Also recommend 1 baby aspirin a day for DVT prophylaxis

## 2022-05-23 ENCOUNTER — Telehealth: Payer: Self-pay | Admitting: Orthopedic Surgery

## 2022-05-23 NOTE — Telephone Encounter (Signed)
Pt called requesting an appt. Seen in ED for fractured fibula and Dr Marlou Sa or Lurena Joiner has any opens in a wk. Please call pt at [phone].
